# Patient Record
Sex: Female | Born: 1960 | Race: White | Hispanic: No | Marital: Single | Smoking: Never smoker
Health system: Southern US, Community
[De-identification: ages and names within clinical notes are randomized; demographics above are authoritative.]

## PROBLEM LIST (undated history)

## (undated) DIAGNOSIS — I341 Nonrheumatic mitral (valve) prolapse: Secondary | ICD-10-CM

## (undated) DIAGNOSIS — F418 Other specified anxiety disorders: Secondary | ICD-10-CM

## (undated) DIAGNOSIS — C541 Malignant neoplasm of endometrium: Secondary | ICD-10-CM

## (undated) DIAGNOSIS — M1990 Unspecified osteoarthritis, unspecified site: Secondary | ICD-10-CM

## (undated) HISTORY — DX: Malignant neoplasm of endometrium: C54.1

## (undated) HISTORY — DX: Other specified anxiety disorders: F41.8

## (undated) HISTORY — PX: LAPAROSCOPIC HYSTERECTOMY: SHX1926

## (undated) HISTORY — DX: Unspecified osteoarthritis, unspecified site: M19.90

## (undated) HISTORY — PX: ABDOMINAL HYSTERECTOMY: SHX81

## (undated) HISTORY — DX: Nonrheumatic mitral (valve) prolapse: I34.1

---

## 1998-01-12 ENCOUNTER — Emergency Department (HOSPITAL_COMMUNITY): Admission: EM | Admit: 1998-01-12 | Discharge: 1998-01-12 | Payer: Self-pay | Admitting: Emergency Medicine

## 1998-09-08 ENCOUNTER — Emergency Department (HOSPITAL_COMMUNITY): Admission: EM | Admit: 1998-09-08 | Discharge: 1998-09-08 | Payer: Self-pay

## 2000-12-27 ENCOUNTER — Ambulatory Visit (HOSPITAL_COMMUNITY): Admission: RE | Admit: 2000-12-27 | Discharge: 2000-12-27 | Payer: Self-pay | Admitting: Family Medicine

## 2000-12-27 ENCOUNTER — Encounter: Payer: Self-pay | Admitting: Family Medicine

## 2002-03-03 ENCOUNTER — Encounter: Payer: Self-pay | Admitting: Family Medicine

## 2002-03-03 ENCOUNTER — Ambulatory Visit (HOSPITAL_COMMUNITY): Admission: RE | Admit: 2002-03-03 | Discharge: 2002-03-03 | Payer: Self-pay | Admitting: Family Medicine

## 2004-04-21 ENCOUNTER — Ambulatory Visit (HOSPITAL_COMMUNITY): Admission: RE | Admit: 2004-04-21 | Discharge: 2004-04-21 | Payer: Self-pay | Admitting: Family Medicine

## 2004-06-05 ENCOUNTER — Other Ambulatory Visit: Admission: RE | Admit: 2004-06-05 | Discharge: 2004-06-05 | Payer: Self-pay | Admitting: Family Medicine

## 2005-06-18 ENCOUNTER — Other Ambulatory Visit: Admission: RE | Admit: 2005-06-18 | Discharge: 2005-06-18 | Payer: Self-pay | Admitting: Family Medicine

## 2005-07-17 ENCOUNTER — Ambulatory Visit (HOSPITAL_COMMUNITY): Admission: RE | Admit: 2005-07-17 | Discharge: 2005-07-17 | Payer: Self-pay | Admitting: Family Medicine

## 2006-01-15 ENCOUNTER — Other Ambulatory Visit: Admission: RE | Admit: 2006-01-15 | Discharge: 2006-01-15 | Payer: Self-pay | Admitting: Family Medicine

## 2006-09-03 ENCOUNTER — Ambulatory Visit (HOSPITAL_COMMUNITY): Admission: RE | Admit: 2006-09-03 | Discharge: 2006-09-03 | Payer: Self-pay | Admitting: Family Medicine

## 2006-11-22 ENCOUNTER — Other Ambulatory Visit: Admission: RE | Admit: 2006-11-22 | Discharge: 2006-11-22 | Payer: Self-pay | Admitting: Family Medicine

## 2007-11-29 ENCOUNTER — Other Ambulatory Visit: Admission: RE | Admit: 2007-11-29 | Discharge: 2007-11-29 | Payer: Self-pay | Admitting: Family Medicine

## 2007-12-08 ENCOUNTER — Ambulatory Visit (HOSPITAL_COMMUNITY): Admission: RE | Admit: 2007-12-08 | Discharge: 2007-12-08 | Payer: Self-pay | Admitting: Family Medicine

## 2008-12-18 ENCOUNTER — Ambulatory Visit (HOSPITAL_COMMUNITY): Admission: RE | Admit: 2008-12-18 | Discharge: 2008-12-18 | Payer: Self-pay | Admitting: Family Medicine

## 2009-01-15 ENCOUNTER — Other Ambulatory Visit: Admission: RE | Admit: 2009-01-15 | Discharge: 2009-01-15 | Payer: Self-pay | Admitting: Family Medicine

## 2009-12-30 ENCOUNTER — Other Ambulatory Visit: Admission: RE | Admit: 2009-12-30 | Discharge: 2009-12-30 | Payer: Self-pay | Admitting: Family Medicine

## 2010-01-06 ENCOUNTER — Ambulatory Visit (HOSPITAL_COMMUNITY): Admission: RE | Admit: 2010-01-06 | Discharge: 2010-01-06 | Payer: Self-pay | Admitting: Family Medicine

## 2010-12-05 ENCOUNTER — Other Ambulatory Visit (HOSPITAL_COMMUNITY): Payer: Self-pay | Admitting: Family Medicine

## 2010-12-05 DIAGNOSIS — Z1231 Encounter for screening mammogram for malignant neoplasm of breast: Secondary | ICD-10-CM

## 2010-12-09 ENCOUNTER — Other Ambulatory Visit (HOSPITAL_COMMUNITY)
Admission: RE | Admit: 2010-12-09 | Discharge: 2010-12-09 | Disposition: A | Discharge status: Home / Self Care | Payer: BC Managed Care – PPO | Source: Ambulatory Visit | Attending: Family Medicine | Admitting: Family Medicine

## 2010-12-09 ENCOUNTER — Other Ambulatory Visit: Payer: Self-pay | Admitting: Family Medicine

## 2010-12-09 DIAGNOSIS — Z124 Encounter for screening for malignant neoplasm of cervix: Secondary | ICD-10-CM | POA: Insufficient documentation

## 2011-01-08 ENCOUNTER — Ambulatory Visit (HOSPITAL_COMMUNITY)
Admission: RE | Admit: 2011-01-08 | Discharge: 2011-01-08 | Disposition: A | Discharge status: Home / Self Care | Payer: BC Managed Care – PPO | Source: Ambulatory Visit | Attending: Family Medicine | Admitting: Family Medicine

## 2011-01-08 ENCOUNTER — Ambulatory Visit (HOSPITAL_COMMUNITY): Payer: Self-pay

## 2011-01-08 DIAGNOSIS — Z1231 Encounter for screening mammogram for malignant neoplasm of breast: Secondary | ICD-10-CM | POA: Insufficient documentation

## 2011-12-16 ENCOUNTER — Other Ambulatory Visit (HOSPITAL_COMMUNITY): Payer: Self-pay | Admitting: Family Medicine

## 2011-12-16 DIAGNOSIS — Z1231 Encounter for screening mammogram for malignant neoplasm of breast: Secondary | ICD-10-CM

## 2012-01-05 ENCOUNTER — Other Ambulatory Visit (HOSPITAL_COMMUNITY)
Admission: RE | Admit: 2012-01-05 | Discharge: 2012-01-05 | Disposition: A | Discharge status: Home / Self Care | Payer: BC Managed Care – PPO | Source: Ambulatory Visit | Attending: Family Medicine | Admitting: Family Medicine

## 2012-01-05 ENCOUNTER — Other Ambulatory Visit: Payer: Self-pay | Admitting: Family Medicine

## 2012-01-05 DIAGNOSIS — Z124 Encounter for screening for malignant neoplasm of cervix: Secondary | ICD-10-CM | POA: Insufficient documentation

## 2012-01-11 ENCOUNTER — Ambulatory Visit (HOSPITAL_COMMUNITY)
Admission: RE | Admit: 2012-01-11 | Discharge: 2012-01-11 | Disposition: A | Discharge status: Home / Self Care | Payer: BC Managed Care – PPO | Source: Ambulatory Visit | Attending: Family Medicine | Admitting: Family Medicine

## 2012-01-11 DIAGNOSIS — Z1231 Encounter for screening mammogram for malignant neoplasm of breast: Secondary | ICD-10-CM | POA: Insufficient documentation

## 2013-01-12 ENCOUNTER — Other Ambulatory Visit (HOSPITAL_COMMUNITY): Payer: Self-pay | Admitting: Family Medicine

## 2013-01-12 DIAGNOSIS — Z1231 Encounter for screening mammogram for malignant neoplasm of breast: Secondary | ICD-10-CM

## 2013-01-13 ENCOUNTER — Ambulatory Visit (HOSPITAL_COMMUNITY)
Admission: RE | Admit: 2013-01-13 | Discharge: 2013-01-13 | Disposition: A | Discharge status: Home / Self Care | Payer: BC Managed Care – PPO | Source: Ambulatory Visit | Attending: Family Medicine | Admitting: Family Medicine

## 2013-01-13 DIAGNOSIS — Z1231 Encounter for screening mammogram for malignant neoplasm of breast: Secondary | ICD-10-CM

## 2013-12-25 ENCOUNTER — Other Ambulatory Visit (HOSPITAL_COMMUNITY): Payer: Self-pay | Admitting: Family Medicine

## 2013-12-25 ENCOUNTER — Other Ambulatory Visit (HOSPITAL_COMMUNITY): Payer: Self-pay | Admitting: Lactation Services

## 2013-12-25 DIAGNOSIS — Z1231 Encounter for screening mammogram for malignant neoplasm of breast: Secondary | ICD-10-CM

## 2014-01-15 ENCOUNTER — Ambulatory Visit (HOSPITAL_COMMUNITY): Payer: BC Managed Care – PPO

## 2014-01-16 ENCOUNTER — Ambulatory Visit (HOSPITAL_COMMUNITY)
Admission: RE | Admit: 2014-01-16 | Discharge: 2014-01-16 | Disposition: A | Discharge status: Home / Self Care | Payer: BC Managed Care – PPO | Source: Ambulatory Visit | Attending: Family Medicine | Admitting: Family Medicine

## 2014-01-16 DIAGNOSIS — Z1231 Encounter for screening mammogram for malignant neoplasm of breast: Secondary | ICD-10-CM | POA: Insufficient documentation

## 2015-01-15 ENCOUNTER — Other Ambulatory Visit: Payer: Self-pay | Admitting: Family Medicine

## 2015-01-15 ENCOUNTER — Other Ambulatory Visit (HOSPITAL_COMMUNITY)
Admission: RE | Admit: 2015-01-15 | Discharge: 2015-01-15 | Disposition: A | Discharge status: Home / Self Care | Payer: BC Managed Care – PPO | Source: Ambulatory Visit | Attending: Family Medicine | Admitting: Family Medicine

## 2015-01-15 DIAGNOSIS — Z1151 Encounter for screening for human papillomavirus (HPV): Secondary | ICD-10-CM | POA: Diagnosis present

## 2015-01-15 DIAGNOSIS — Z01411 Encounter for gynecological examination (general) (routine) with abnormal findings: Secondary | ICD-10-CM | POA: Diagnosis not present

## 2015-01-16 LAB — CYTOLOGY - PAP

## 2015-04-05 ENCOUNTER — Other Ambulatory Visit: Payer: Self-pay

## 2015-04-05 DIAGNOSIS — Z1231 Encounter for screening mammogram for malignant neoplasm of breast: Secondary | ICD-10-CM

## 2015-04-24 ENCOUNTER — Ambulatory Visit
Admission: RE | Admit: 2015-04-24 | Discharge: 2015-04-24 | Disposition: A | Discharge status: Home / Self Care | Payer: BC Managed Care – PPO | Source: Ambulatory Visit

## 2015-04-24 DIAGNOSIS — Z1231 Encounter for screening mammogram for malignant neoplasm of breast: Secondary | ICD-10-CM

## 2016-03-24 ENCOUNTER — Other Ambulatory Visit: Payer: Self-pay | Admitting: Family Medicine

## 2016-03-24 DIAGNOSIS — Z1231 Encounter for screening mammogram for malignant neoplasm of breast: Secondary | ICD-10-CM

## 2016-04-27 ENCOUNTER — Ambulatory Visit
Admission: RE | Admit: 2016-04-27 | Discharge: 2016-04-27 | Disposition: A | Discharge status: Home / Self Care | Payer: BC Managed Care – PPO | Source: Ambulatory Visit | Attending: Family Medicine | Admitting: Family Medicine

## 2016-04-27 DIAGNOSIS — Z1231 Encounter for screening mammogram for malignant neoplasm of breast: Secondary | ICD-10-CM

## 2017-05-07 ENCOUNTER — Other Ambulatory Visit: Payer: Self-pay | Admitting: Family Medicine

## 2017-05-07 DIAGNOSIS — Z139 Encounter for screening, unspecified: Secondary | ICD-10-CM

## 2017-05-28 ENCOUNTER — Other Ambulatory Visit: Payer: Self-pay | Admitting: Family Medicine

## 2017-05-28 DIAGNOSIS — I340 Nonrheumatic mitral (valve) insufficiency: Secondary | ICD-10-CM

## 2017-05-31 ENCOUNTER — Other Ambulatory Visit (HOSPITAL_COMMUNITY): Payer: BC Managed Care – PPO

## 2017-06-08 ENCOUNTER — Ambulatory Visit: Payer: BC Managed Care – PPO

## 2017-06-14 ENCOUNTER — Telehealth (HOSPITAL_COMMUNITY): Payer: Self-pay | Admitting: Family Medicine

## 2017-06-14 NOTE — Telephone Encounter (Signed)
05/31/17 Patient called and cancelled due to billing concerns. EVD 06/09/17 lmom to sched. echo EVD 06/10/17 lmom to schedule echo EVD 06/14/17 Called and spoke with patient and she voiced that she can not afford test..RG

## 2017-06-28 ENCOUNTER — Other Ambulatory Visit: Payer: Self-pay | Admitting: Obstetrics and Gynecology

## 2017-06-28 ENCOUNTER — Ambulatory Visit: Payer: BC Managed Care – PPO

## 2017-06-28 ENCOUNTER — Other Ambulatory Visit (HOSPITAL_COMMUNITY)
Admission: RE | Admit: 2017-06-28 | Discharge: 2017-06-28 | Disposition: A | Discharge status: Home / Self Care | Payer: BC Managed Care – PPO | Source: Ambulatory Visit | Attending: Obstetrics and Gynecology | Admitting: Obstetrics and Gynecology

## 2017-06-28 DIAGNOSIS — Z124 Encounter for screening for malignant neoplasm of cervix: Secondary | ICD-10-CM | POA: Insufficient documentation

## 2017-07-01 LAB — CYTOLOGY - PAP
Diagnosis: NEGATIVE
HPV: NOT DETECTED

## 2017-07-15 ENCOUNTER — Ambulatory Visit
Admission: RE | Admit: 2017-07-15 | Discharge: 2017-07-15 | Disposition: A | Discharge status: Home / Self Care | Payer: BC Managed Care – PPO | Source: Ambulatory Visit | Attending: Family Medicine | Admitting: Family Medicine

## 2017-07-15 DIAGNOSIS — Z139 Encounter for screening, unspecified: Secondary | ICD-10-CM

## 2018-05-09 ENCOUNTER — Encounter: Payer: Self-pay | Admitting: Neurology

## 2018-05-09 ENCOUNTER — Other Ambulatory Visit: Payer: Self-pay | Admitting: *Deleted

## 2018-05-09 DIAGNOSIS — R200 Anesthesia of skin: Secondary | ICD-10-CM

## 2018-06-02 ENCOUNTER — Ambulatory Visit (INDEPENDENT_AMBULATORY_CARE_PROVIDER_SITE_OTHER): Payer: BC Managed Care – PPO | Admitting: Neurology

## 2018-06-02 DIAGNOSIS — G5603 Carpal tunnel syndrome, bilateral upper limbs: Secondary | ICD-10-CM

## 2018-06-02 DIAGNOSIS — R200 Anesthesia of skin: Secondary | ICD-10-CM | POA: Diagnosis not present

## 2018-06-02 NOTE — Procedures (Signed)
[facility] Neu[facility]  [address]  [place], [place] [address] Tel: [phone] Fax:  [phone] Test Date:  06/02/2018  Patient: Latasha Clark DOB: June 07, 1960 Physician: Narda Amber, DO  Sex: Female Height: 5\' 7"  Ref Phys: Daryll Brod, MD  ID#: [id number] Temp: 36.0C Technician:    Patient Complaints: This is a 58 year old female referred for evaluation of bilateral hand numbness.  NCV & EMG Findings: Extensive electrodiagnostic testing of the right upper extremity and additional studies of the left shows:  1. Bilateral median sensory responses show prolonged distal peak latency (R4.7, L5.8 ms) and reduced amplitude (R8.2, L5.0 V).  Bilateral ulnar sensory responses are within normal limits. 2. Bilateral median motor responses show prolonged latency (R4.4, 5.2 ms) and the amplitude is reduced on the left (5.2 mV).  Additionally there is evidence of a median-to-ulnar crossover in the forearm as seen by a motor response stimulating at the ulnar-wrist and recording at the abductor pollicis brevis muscle.  Bilateral ulnar motor responses are within normal limits. 3. 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  These findings are not present in the right upper extremity.  Impression: Bilateral median neuropathy at or distal to the wrist, consistent with a clinical diagnosis of carpal tunnel syndrome.  Overall, these findings are moderate-to-severe in degree electrically and worse on the left.  Incidentally, there is evidence of bilateral Martin-Gruber anastomosis, a normal variant.   ___________________________ Narda Amber, DO    Nerve Conduction Studies Anti Sensory Summary Table   Site NR Peak (ms) Norm Peak (ms) P-T Amp (V) Norm P-T Amp  Left Median Anti Sensory (2nd Digit)  36C  Wrist    5.8 <3.6 5.0 >15  Right Median Anti Sensory (2nd Digit)  36C  Wrist    4.7 <3.6  8.2 >15  Left Ulnar Anti Sensory (5th Digit)  36C  Wrist    2.7 <3.1 23.5 >10  Right Ulnar Anti Sensory (5th Digit)  36C  Wrist    2.6 <3.1 23.9 >10   Motor Summary Table   Site NR Onset (ms) Norm Onset (ms) O-P Amp (mV) Norm O-P Amp Site1 Site2 Delta-0 (ms) Dist (cm) Vel (m/s) Norm Vel (m/s)  Left Median Motor (Abd Poll Brev)  36C  Wrist    5.2 <4.0 5.2 >6 Elbow Wrist 5.3 30.0 57 >50  Elbow    10.5  4.6  Ulnar-wrist crossover Elbow 5.7 0.0    Ulnar-wrist crossover    4.8  2.2         Right Median Motor (Abd Poll Brev)  36C  Wrist    4.4 <4.0 7.2 >6 Elbow Wrist 5.6 30.0 54 >50  Elbow    10.0  8.0  Ulnar-wrist crossover Elbow 6.3 0.0    Ulnar-wrist crossover    3.7  2.0         Left Ulnar Motor (Abd Dig Minimi)  36C  Wrist    1.8 <3.1 8.7 >7 B Elbow Wrist 4.1 24.0 59 >50  B Elbow    5.9  7.9  A Elbow B Elbow 1.9 10.0 53 >50  A Elbow    7.8  7.6         Right Ulnar Motor (Abd Dig Minimi)  36C  Wrist    1.5 <3.1 9.9 >7 B Elbow Wrist 4.5 24.0 53 >50  B Elbow    6.0  9.6  A Elbow B Elbow 1.9 10.0  53 >50  A Elbow    7.9  9.3          EMG   Side Muscle Ins Act Fibs Psw Fasc Number Recrt Dur Dur. Amp Amp. Poly Poly. Comment  Right 1stDorInt Nml Nml Nml Nml Nml Nml Nml Nml Nml Nml Nml Nml N/A  Right Abd Poll Brev Nml Nml Nml Nml Nml Nml Nml Nml Nml Nml Nml Nml N/A  Right PronatorTeres Nml Nml Nml Nml Nml Nml Nml Nml Nml Nml Nml Nml N/A  Right Biceps Nml Nml Nml Nml Nml Nml Nml Nml Nml Nml Nml Nml N/A  Right Triceps Nml Nml Nml Nml Nml Nml Nml Nml Nml Nml Nml Nml N/A  Right Deltoid Nml Nml Nml Nml Nml Nml Nml Nml Nml Nml Nml Nml N/A  Left 1stDorInt Nml Nml Nml Nml Nml Nml Nml Nml Nml Nml Nml Nml N/A  Left Abd Poll Brev Nml Nml Nml Nml 2- Rapid Some 1+ Some 1+ Some 1+ N/A  Left PronatorTeres Nml Nml Nml Nml Nml Nml Nml Nml Nml Nml Nml Nml N/A  Left Biceps Nml Nml Nml Nml Nml Nml Nml Nml Nml Nml Nml Nml N/A  Left Triceps Nml Nml Nml Nml Nml Nml Nml Nml Nml Nml Nml Nml N/A  Left  Deltoid Nml Nml Nml Nml Nml Nml Nml Nml Nml Nml Nml Nml N/A      Waveforms:

## 2018-06-08 ENCOUNTER — Other Ambulatory Visit: Payer: Self-pay | Admitting: Family Medicine

## 2018-06-08 DIAGNOSIS — Z1231 Encounter for screening mammogram for malignant neoplasm of breast: Secondary | ICD-10-CM

## 2018-07-18 ENCOUNTER — Ambulatory Visit: Payer: BC Managed Care – PPO

## 2018-08-17 ENCOUNTER — Ambulatory Visit
Admission: RE | Admit: 2018-08-17 | Discharge: 2018-08-17 | Disposition: A | Discharge status: Home / Self Care | Payer: BC Managed Care – PPO | Source: Ambulatory Visit | Attending: Family Medicine | Admitting: Family Medicine

## 2018-08-17 ENCOUNTER — Other Ambulatory Visit: Payer: Self-pay

## 2018-08-17 DIAGNOSIS — Z1231 Encounter for screening mammogram for malignant neoplasm of breast: Secondary | ICD-10-CM

## 2018-12-26 ENCOUNTER — Other Ambulatory Visit: Payer: Self-pay | Admitting: Obstetrics and Gynecology

## 2018-12-27 ENCOUNTER — Other Ambulatory Visit: Payer: Self-pay | Admitting: Obstetrics and Gynecology

## 2018-12-27 ENCOUNTER — Other Ambulatory Visit: Payer: Self-pay

## 2018-12-27 ENCOUNTER — Ambulatory Visit
Admission: RE | Admit: 2018-12-27 | Discharge: 2018-12-27 | Disposition: A | Discharge status: Home / Self Care | Payer: BC Managed Care – PPO | Source: Ambulatory Visit | Attending: Obstetrics and Gynecology | Admitting: Obstetrics and Gynecology

## 2018-12-27 DIAGNOSIS — N950 Postmenopausal bleeding: Secondary | ICD-10-CM

## 2018-12-27 MED ORDER — IOPAMIDOL (ISOVUE-300) INJECTION 61%
100.0000 mL | Freq: Once | INTRAVENOUS | Status: AC | PRN
  Administered 2018-12-27: 100 mL via INTRAVENOUS

## 2018-12-30 ENCOUNTER — Other Ambulatory Visit: Payer: BC Managed Care – PPO

## 2019-01-10 ENCOUNTER — Inpatient Hospital Stay (HOSPITAL_COMMUNITY)
Admission: RE | Admit: 2019-01-10 | Discharge: 2019-01-10 | Disposition: A | Discharge status: Home / Self Care | Payer: BC Managed Care – PPO | Source: Ambulatory Visit

## 2019-01-10 NOTE — Progress Notes (Addendum)
Patient arrived to admitting office. Per patient Dr. Landry Mellow is cancelling her surgery and she no longer needs a PAT appointment.  Informed PAT scheduler.

## 2019-01-10 NOTE — Pre-Procedure Instructions (Signed)
Latasha Clark  01/10/2019      [id number] - [address], [address] [place] [address] Phone: [phone] Fax: [phone]    Your procedure is scheduled on January 18, 2019.  Report to [facility]mitting at 115 PM.  Call this number if you have problems the morning of surgery:  [phone]  Call [phone] if you have any questions prior to your surgery date Monday-Friday 8am-4pm    Remember:  Do not eat or drink after midnight.    Take these medicines the morning of surgery with A SIP OF WATER  Citalopram (celexa) Clonazepam (Klonopin)-If needed Albuterol inhaler-if needed Astelin nasal spray-if needed  7 days prior to surgery STOP taking any Allegra-D, Aspirin (unless otherwise instructed by your surgeon) NSAIDS: Indocin (Indomethacin) Aleve, Naproxen, Ibuprofen, Motrin, Advil, Goody's, BC's, all herbal medications, fish oil, and all vitamins    Day of surgery:  Do not wear jewelry, make-up or nail polish.  Do not wear lotions, powders, or perfumes, or deodorant.  Do not shave 48 hours prior to surgery.   Do not bring valuables to the hospital.  [facility] is not responsible for any belongings or valuables.  Contacts, dentures or bridgework may not be worn into surgery.  Leave your suitcase in the car.  After surgery it may be brought to your room.  For patients admitted to the hospital, discharge time will be determined by your treatment team.  Patients discharged the day of surgery will not be allowed to drive home.    [facility]- Preparing For Surgery  Before surgery, you can play an important role. Because skin is not sterile, your skin needs to be as free of germs as possible. You can reduce the number of germs on your skin by washing with CHG (chlorahexidine gluconate) Soap before surgery.  CHG is an antiseptic cleaner which kills germs and bonds with the skin to continue killing germs even  after washing.    Oral Hygiene is also important to reduce your risk of infection.  Remember - BRUSH YOUR TEETH THE MORNING OF SURGERY WITH YOUR REGULAR TOOTHPASTE  Please do not use if you have an allergy to CHG or antibacterial soaps. If your skin becomes reddened/irritated stop using the CHG.  Do not shave (including legs and underarms) for at least 48 hours prior to first CHG shower. It is OK to shave your face.  Please follow these instructions carefully.   1. Shower the NIGHT BEFORE SURGERY and the MORNING OF SURGERY with CHG.   2. If you chose to wash your hair, wash your hair first as usual with your normal shampoo.  3. After you shampoo, rinse your hair and body thoroughly to remove the shampoo.  4. Use CHG as you would any other liquid soap. You can apply CHG directly to the skin and wash gently with a scrungie or a clean washcloth.   5. Apply the CHG Soap to your body ONLY FROM THE NECK DOWN.  Do not use on open wounds or open sores. Avoid contact with your eyes, ears, mouth and genitals (private parts). Wash Face and genitals (private parts)  with your normal soap.  6. Wash thoroughly, paying special attention to the area where your surgery will be performed.  7. Thoroughly rinse your body with warm water from the neck down.  8. DO NOT shower/wash with your normal soap after using and rinsing off the CHG Soap.  9. [place]  yourself dry with a CLEAN TOWEL.  10. Wear CLEAN PAJAMAS to bed the night before surgery, wear comfortable clothes the morning of surgery  11. Place CLEAN SHEETS on your bed the night of your first shower and DO NOT SLEEP WITH PETS.  Day of Surgery: Shower as above Do not apply any deodorants/lotions.  Please wear clean clothes to the hospital/surgery center.   Remember to brush your teeth WITH YOUR REGULAR TOOTHPASTE.  Please read over the following fact sheets that you were given.

## 2019-01-14 ENCOUNTER — Inpatient Hospital Stay (HOSPITAL_COMMUNITY): Admission: RE | Admit: 2019-01-14 | Payer: BC Managed Care – PPO | Source: Ambulatory Visit

## 2019-01-18 ENCOUNTER — Ambulatory Visit: Admit: 2019-01-18 | Payer: BC Managed Care – PPO | Admitting: Obstetrics and Gynecology

## 2019-01-18 SURGERY — DILATATION & CURETTAGE/HYSTEROSCOPY WITH MYOSURE
Anesthesia: Choice

## 2019-07-13 ENCOUNTER — Other Ambulatory Visit: Payer: Self-pay | Admitting: Family Medicine

## 2019-07-13 DIAGNOSIS — Z1231 Encounter for screening mammogram for malignant neoplasm of breast: Secondary | ICD-10-CM

## 2019-07-30 ENCOUNTER — Ambulatory Visit: Payer: BC Managed Care – PPO | Attending: Family Medicine

## 2019-07-30 DIAGNOSIS — Z23 Encounter for immunization: Secondary | ICD-10-CM | POA: Insufficient documentation

## 2019-07-30 NOTE — Progress Notes (Signed)
   Covid-19 Vaccination Clinic  Name:  Latasha Clark    MRN: [id number] DOB: 02-Jul-1960  07/30/2019  [name] was observed post Covid-19 immunization for 15 minutes without incidence. She was provided with Vaccine Information Sheet and instruction to access the V-Safe system.   [name] was instructed to call 911 with any severe reactions post vaccine: Marland Kitchen Difficulty breathing  . Swelling of your face and throat  . A fast heartbeat  . A bad rash all over your body  . Dizziness and weakness    Immunizations Administered    Name Date Dose VIS Date Route   Pfizer COVID-19 Vaccine 07/30/2019  9:55 AM 0.3 mL 05/12/2019 Intramuscular   Manufacturer: [place]   Lot: [id number]   [place]: [id number]

## 2019-08-18 ENCOUNTER — Other Ambulatory Visit: Payer: Self-pay

## 2019-08-18 ENCOUNTER — Ambulatory Visit
Admission: RE | Admit: 2019-08-18 | Discharge: 2019-08-18 | Disposition: A | Discharge status: Home / Self Care | Payer: BC Managed Care – PPO | Source: Ambulatory Visit | Attending: Family Medicine | Admitting: Family Medicine

## 2019-08-18 DIAGNOSIS — Z1231 Encounter for screening mammogram for malignant neoplasm of breast: Secondary | ICD-10-CM

## 2019-08-21 ENCOUNTER — Ambulatory Visit: Payer: BC Managed Care – PPO | Attending: Internal Medicine

## 2019-08-21 DIAGNOSIS — Z23 Encounter for immunization: Secondary | ICD-10-CM

## 2019-08-21 NOTE — Progress Notes (Signed)
   Covid-19 Vaccination Clinic  Name:  Latasha Clark    MRN: [id number] DOB: 06/01/1961  08/21/2019  [name] was observed post Covid-19 immunization for 15 minutes without incident. She was provided with Vaccine Information Sheet and instruction to access the V-Safe system.   [name] was instructed to call 911 with any severe reactions post vaccine: Marland Kitchen Difficulty breathing  . Swelling of face and throat  . A fast heartbeat  . A bad rash all over body  . Dizziness and weakness   Immunizations Administered    Name Date Dose VIS Date Route   Pfizer COVID-19 Vaccine 08/21/2019  9:17 AM 0.3 mL 05/12/2019 Intramuscular   Manufacturer: [place]   Lot: [id number]   [place]: [id number]

## 2019-11-03 NOTE — Progress Notes (Deleted)
Date:  11/03/2019   ID:  Mackensey Bolte, DOB May 13, 1961, MRN [id number]  PCP:  Salvatore Marvel, PA-C  Cardiologist:  Rex Kras, DO, [facility] Providers: *** Cardiothoracic Surgeon: *** Electrophysiologist:  None   REASON FOR CONSULT: ***  REQUESTING PHYSICIAN:  Salvatore Marvel [place],  [place] [id number]  No chief complaint on file.   HPI  Latasha Clark is a 59 y.o. female who is being seen today for the evaluation of *** at the request of Salvatore Marvel, [place]. Patient's past medical history and cardiac risk factors include: ***  Patient is unaccompanied at today's office visit.  Patient is accompanied by *** at today's office visit.    ***  History of  Denies prior history of coronary artery disease, myocardial infarction, congestive heart failure, deep venous thrombosis, pulmonary embolism, stroke, transient ischemic attack.  FUNCTIONAL STATUS: ***   ALLERGIES: Allergies  Allergen Reactions   Penicillins Anaphylaxis and Other (See Comments)    Closed up throat Did it involve swelling of the face/tongue/throat, SOB, or low BP? Yes Did it involve sudden or severe rash/hives, skin peeling, or any reaction on the inside of your mouth or nose? No Did you need to seek medical attention at a hospital or doctor's office? Yes When did it last happen?30s If all above answers are NO, may proceed with cephalosporin use.    Sulfa Antibiotics Rash    MEDICATION LIST PRIOR TO VISIT: No outpatient medications have been marked as taking for the 11/06/19 encounter (Appointment) with Terri Skains, [place], DO.     PAST MEDICAL HISTORY: Past Medical History:  Diagnosis Date   Depression with anxiety    Chronic SSRI and PRN Klonopin   MVP (mitral valve prolapse)    with regurgitation- ABX prophylaxis: ECHO Q 2-3 years    PAST SURGICAL HISTORY: No past surgical history on file.  FAMILY HISTORY: The patient family history includes  Colon cancer in her maternal aunt; Colon polyps in her mother; Diabetes in her mother; Macular degeneration in her mother.  SOCIAL HISTORY:  The patient  reports that she has never smoked. She does not have any smokeless tobacco history on file. She reports current alcohol use. She reports that she does not use drugs.  REVIEW OF SYSTEMS: ROS  PHYSICAL EXAM: No flowsheet data found.  CONSTITUTIONAL: Well-developed and well-nourished. No acute distress.  SKIN: Skin is warm and dry. No rash noted. No cyanosis. No pallor. No jaundice HEAD: Normocephalic and atraumatic.  EYES: No scleral icterus MOUTH/THROAT: Moist oral membranes.  NECK: No JVD present. No thyromegaly noted. No carotid bruits  LYMPHATIC: No visible cervical adenopathy.  CHEST Normal respiratory effort. No intercostal retractions  LUNGS: *** No stridor. No wheezes. No rales.  CARDIOVASCULAR: *** ABDOMINAL: No apparent ascites.  EXTREMITIES: No peripheral edema  HEMATOLOGIC: No significant bruising NEUROLOGIC: Oriented to person, place, and time. Nonfocal. Normal muscle tone.  PSYCHIATRIC: Normal mood and affect. Normal behavior. Cooperative  RADIOLOGY: ***  CARDIAC DATABASE: Coronary artery bypass grafting: Year: Surgical anatomy:  Valve surgery:  Pacemaker/ICD in situ:  EKG: *** 03/27/2019: Sinus Rhythm. Normal ECG No prev ECGs available.  Echocardiogram: ***  Stress Testing: ***  Heart Catheterization: ***  Carotid duplex: ***  Vascular imaging: US Abdomen (AAA screening): Ultrasound arterial duplex: Ultrasound venous duplex:   Holter:  14-Day mobile cardiac ambulatory telemetry:  LABORATORY DATA: No flowsheet data found.  No flowsheet data found.  Lipid Panel  No results found for:  CHOL, TRIG, HDL, CHOLHDL, VLDL, LDLCALC, LDLDIRECT, LABVLDL  No results found for: HGBA1C No components found for: NTPROBNP No results found for: TSH  BMP No results for input(s): NA, K, CL, CO2,  GLUCOSE, BUN, CREATININE, CALCIUM, GFRNONAA, GFRAA in the last 8760 hours.  CBC No results for input(s): WBC, RBC, HGB, HCT, PLT, MCV, MCH, MCHC, RDW, LYMPHSABS, MONOABS, EOSABS, BASOSABS in the last 168 hours.  Invalid input(s): NEUTRABS  HEMOGLOBIN A1C No results found for: HGBA1C, MPG  Cardiac Panel (last 3 results) No results for input(s): CKTOTAL, CKMB, TROPONINI, RELINDX in the last 8760 hours. No results for input(s): TROPIPOC in the last 8760 hours.  BNP (last 3 results) No results for input(s): PROBNP in the last 8760 hours.  TSH No results for input(s): TSH in the last 8760 hours.  CHOLESTEROL No results for input(s): CHOL in the last 8760 hours.  Hepatic Function Panel No results for input(s): PROT, ALBUMIN, AST, ALT, ALKPHOS, BILITOT, BILIDIR, IBILI in the last 8760 hours.  External Labs: *** 09/06/2019 [facility]: Glucose 94, BUN/Cr 24/0.76. EGFR 87. Na/K 141/4.0. CO2 31. Rest of the CMP normal H/H 14/40. MCV 87. Platelets 305  Total Vitamin D 25-Hydroxy 17 (L)  >=30 ng/mL   01/202/2020: Chol 233, TG 90, HDL 69, LDL 145.   IMPRESSION:  No diagnosis found.   RECOMMENDATIONS: Latasha Clark is a 59 y.o. female whose past medical history and cardiac risk factors include: ***   FINAL MEDICATION LIST END OF ENCOUNTER: No orders of the defined types were placed in this encounter.   There are no discontinued medications.   Current Outpatient Medications:    albuterol (PROVENTIL HFA;VENTOLIN HFA) 108 (90 BASE) MCG/ACT inhaler, Inhale 2 puffs into the lungs every 4 (four) hours as needed for wheezing or shortness of breath., Disp: , Rfl:    amoxicillin (AMOXIL) 500 MG capsule, Take 400 mg by mouth See admin instructions. For dental procedures, Disp: , Rfl:    azelastine (ASTELIN) 0.1 % nasal spray, Place 1 spray into both nostrils daily as needed for rhinitis or allergies. Use in each nostril as directed, Disp: , Rfl:    B Complex-C (SUPER B COMPLEX  PO), Take 1 tablet by mouth daily., Disp: , Rfl:    citalopram (CELEXA) 40 MG tablet, Take 40 mg by mouth daily., Disp: , Rfl:    clonazePAM (KLONOPIN) 0.5 MG tablet, Take 0.5 mg by mouth at bedtime as needed for anxiety. , Disp: , Rfl:    fexofenadine-pseudoephedrine (ALLEGRA-D) 60-120 MG 12 hr tablet, Take 1 tablet by mouth daily., Disp: , Rfl:    Gluc-Chonn-MSM-Boswellia-Vit D (GLUCOSAMINE CHOND TRIPLE/VIT D PO), Take 2 tablets by mouth daily., Disp: , Rfl:    indomethacin (INDOCIN) 25 MG capsule, Take 25 mg by mouth daily. , Disp: , Rfl:   No orders of the defined types were placed in this encounter.   There are no Patient Instructions on file for this visit.   --Continue cardiac medications as reconciled in final medication list. --No follow-ups on file. Or sooner if needed. --Continue follow-up with your primary care physician regarding the management of your other chronic comorbid conditions.  Patient's questions and concerns were addressed to her satisfaction. She voices understanding of the instructions provided during this encounter.   This note was created using a voice recognition software as a result there may be grammatical errors inadvertently enclosed that do not reflect the nature of this encounter. Every attempt is made to correct such errors.  [place]  [place] [facility]  Pager: [phone] Office: [phone]

## 2019-11-06 ENCOUNTER — Ambulatory Visit: Payer: Self-pay | Admitting: Cardiology

## 2020-07-24 ENCOUNTER — Other Ambulatory Visit: Payer: Self-pay | Admitting: Nurse Practitioner

## 2020-07-24 DIAGNOSIS — Z1231 Encounter for screening mammogram for malignant neoplasm of breast: Secondary | ICD-10-CM

## 2020-07-29 ENCOUNTER — Other Ambulatory Visit: Payer: Self-pay

## 2020-07-29 ENCOUNTER — Encounter: Payer: Self-pay | Admitting: Neurology

## 2020-07-29 DIAGNOSIS — R200 Anesthesia of skin: Secondary | ICD-10-CM

## 2020-07-29 DIAGNOSIS — R202 Paresthesia of skin: Secondary | ICD-10-CM

## 2020-07-29 NOTE — Progress Notes (Signed)
ee

## 2020-09-12 ENCOUNTER — Other Ambulatory Visit: Payer: Self-pay

## 2020-09-12 ENCOUNTER — Ambulatory Visit
Admission: RE | Admit: 2020-09-12 | Discharge: 2020-09-12 | Disposition: A | Discharge status: Home / Self Care | Payer: BC Managed Care – PPO | Source: Ambulatory Visit | Attending: Nurse Practitioner | Admitting: Nurse Practitioner

## 2020-09-12 DIAGNOSIS — Z1231 Encounter for screening mammogram for malignant neoplasm of breast: Secondary | ICD-10-CM

## 2020-09-17 ENCOUNTER — Ambulatory Visit (INDEPENDENT_AMBULATORY_CARE_PROVIDER_SITE_OTHER): Payer: BC Managed Care – PPO | Admitting: Neurology

## 2020-09-17 ENCOUNTER — Other Ambulatory Visit: Payer: Self-pay

## 2020-09-17 DIAGNOSIS — R200 Anesthesia of skin: Secondary | ICD-10-CM | POA: Diagnosis not present

## 2020-09-17 DIAGNOSIS — R202 Paresthesia of skin: Secondary | ICD-10-CM | POA: Diagnosis not present

## 2020-09-17 DIAGNOSIS — G5622 Lesion of ulnar nerve, left upper limb: Secondary | ICD-10-CM

## 2020-09-17 DIAGNOSIS — G5603 Carpal tunnel syndrome, bilateral upper limbs: Secondary | ICD-10-CM

## 2020-09-17 NOTE — Procedures (Signed)
[facility]  [place], [address]  [place], [place] [address] Tel: [phone] Fax:  [phone] Test Date:  09/17/2020  Patient: Latasha Clark DOB: 09/28/1960 Physician: Narda Amber, DO  Sex: Female Height: 5\' 7"  Ref Phys: Berle Mull, M.D.  ID#: [id number]   Technician:    Patient Complaints: This is a 60 year old female referred for evaluation of bilateral hand paresthesias.  NCV & EMG Findings: Extensive electrodiagnostic testing of the right upper extremity and additional studies of the left shows:  1. Bilateral median sensory responses show prolonged latency (R5.1, L4.7 ms) and reduced amplitude (R6.4, L10.5 V).  Bilateral ulnar sensory responses are within normal limits. 2. Bilateral median motor responses show prolonged latency (R4.2 L4.5 ms) and amplitude is reduced on the left (5.2 mV).  Of note, there is evidence of bilateral Martin-Gruber anastomosis, a normal anatomic variant.  Right ulnar motor responses within normal limits.  Left ulnar motor response shows slowed conduction velocity across the elbow (A Elbow-B Elbow, 43 m/s).   3. Chronic motor axon loss changes are isolated to the left abductor pollicis brevis muscle, without accompanied active denervation.  Impression: 1. Bilateral median neuropathy at or distal to the wrist, consistent with a clinical diagnosis of carpal tunnel syndrome.  Overall, these findings are moderate-to-severe in degree electrically and worse on the left.  Findings are stable as compared to study on 06/02/2018. 2. Left ulnar neuropathy with slowing across the elbow, mild.   ___________________________ Narda Amber, DO    Nerve Conduction Studies Anti Sensory Summary Table   Stim Site NR Peak (ms) Norm Peak (ms) P-T Amp (V) Norm P-T Amp  Left Median Anti Sensory (2nd Digit)  35C  Wrist    4.7 <3.6 10.5 >15  Right Median Anti Sensory (2nd Digit)  35C  Wrist    5.1 <3.6 6.4 >15  Left Ulnar Anti Sensory (5th Digit)   35C  Wrist    2.6 <3.1 30.3 >10  Right Ulnar Anti Sensory (5th Digit)  35C  Wrist    2.4 <3.1 30.9 >10   Motor Summary Table   Stim Site NR Onset (ms) Norm Onset (ms) O-P Amp (mV) Norm O-P Amp Site1 Site2 Delta-0 (ms) Dist (cm) Vel (m/s) Norm Vel (m/s)  Left Median Motor (Abd Poll Brev)  35C  Wrist    4.5 <4.0 5.2 >6 Elbow Wrist 4.3 29.0 67 >50  Elbow    8.8  4.8  Ulnar-wrist crossover Elbow 4.3 0.0    Ulnar-wrist crossover    4.5  1.6         Right Median Motor (Abd Poll Brev)  35C  Wrist    4.2 <4.0 6.7 >6 Elbow Wrist 5.5 29.0 53 >50  Elbow    9.7  7.0  Ulnar-wrist crossover Elbow 6.0 0.0    Ulnar-wrist crossover    3.7  1.6         Left Ulnar Motor (Abd Dig Minimi)  35C  Wrist    2.1 <3.1 8.1 >7 B Elbow Wrist 3.4 22.0 65 >50  B Elbow    5.5  7.3  A Elbow B Elbow 2.3 10.0 43 >50  A Elbow    7.8  7.0         Right Ulnar Motor (Abd Dig Minimi)  35C  Wrist    2.0 <3.1 9.3 >7 B Elbow Wrist 3.7 23.0 62 >50  B Elbow    5.7  8.7  A Elbow B Elbow 1.8 10.0 56 >  50  A Elbow    7.5  8.3          EMG   Side Muscle Ins Act Fibs Psw Fasc Number Recrt Dur Dur. Amp Amp. Poly Poly. Comment  Right 1stDorInt Nml Nml Nml Nml Nml Nml Nml Nml Nml Nml Nml Nml N/A  Right Abd Poll Brev Nml Nml Nml Nml Nml Nml Nml Nml Nml Nml Nml Nml N/A  Right PronatorTeres Nml Nml Nml Nml Nml Nml Nml Nml Nml Nml Nml Nml N/A  Right Biceps Nml Nml Nml Nml Nml Nml Nml Nml Nml Nml Nml Nml N/A  Right Triceps Nml Nml Nml Nml Nml Nml Nml Nml Nml Nml Nml Nml N/A  Right Deltoid Nml Nml Nml Nml Nml Nml Nml Nml Nml Nml Nml Nml N/A  Left 1stDorInt Nml Nml Nml Nml Nml Nml Nml Nml Nml Nml Nml Nml N/A  Left ABD Dig Min Nml Nml Nml Nml Nml Nml Nml Nml Nml Nml Nml Nml N/A  Left FlexCarpiUln Nml Nml Nml Nml Nml Nml Nml Nml Nml Nml Nml Nml N/A  Left PronatorTeres Nml Nml Nml Nml Nml Nml Nml Nml Nml Nml Nml Nml N/A  Left Biceps Nml Nml Nml Nml Nml Nml Nml Nml Nml Nml Nml Nml N/A  Left Triceps Nml Nml Nml Nml Nml Nml Nml Nml Nml  Nml Nml Nml N/A  Left Abd Poll Brev Nml Nml Nml Nml 1- Rapid Some 1+ Some 1+ Nml Nml N/A  Left Deltoid Nml Nml Nml Nml Nml Nml Nml Nml Nml Nml Nml Nml N/A      Waveforms:

## 2020-11-28 ENCOUNTER — Ambulatory Visit: Payer: Self-pay | Admitting: Cardiology

## 2020-12-05 ENCOUNTER — Other Ambulatory Visit: Payer: Self-pay

## 2020-12-05 ENCOUNTER — Ambulatory Visit: Payer: BC Managed Care – PPO | Admitting: Cardiology

## 2020-12-05 ENCOUNTER — Encounter: Payer: Self-pay | Admitting: Cardiology

## 2020-12-05 VITALS — BP 102/76 | HR 112 | Temp 98.1°F | Ht 67.0 in | Wt 163.6 lb

## 2020-12-05 DIAGNOSIS — I341 Nonrheumatic mitral (valve) prolapse: Secondary | ICD-10-CM

## 2020-12-05 DIAGNOSIS — R000 Tachycardia, unspecified: Secondary | ICD-10-CM

## 2020-12-05 DIAGNOSIS — I340 Nonrheumatic mitral (valve) insufficiency: Secondary | ICD-10-CM

## 2020-12-05 NOTE — Patient Instructions (Signed)
Discussed with your insurance to see the cost related to a transesophageal echocardiogram.

## 2020-12-05 NOTE — Progress Notes (Signed)
Date:  12/05/2020   ID:  Latasha Clark, DOB 08-18-1960, MRN [id number]  PCP:  Berkley Harvey, NP  Cardiologist:  Rex Kras, DO, [facility] (established care 12/05/2020)  REASON FOR CONSULT: Mitral valve prolapse  REQUESTING PHYSICIAN:  Salvatore Marvel, PA-C [id number] [place],  [place] [address]  Chief Complaint  Patient presents with   Mitral Valve Prolapse    HPI  Latasha Clark is a 60 y.o. female who presents to the office with a chief complaint of " evaluation of mitral valve prolapse." Patient's past medical history and cardiovascular risk factors include: MVP, mitral regurgitation, hyperlipidemia, postmenopausal female.  She is referred to the office at the request of Salvatore Marvel, [place] for evaluation of MVP.  Patient states that she was diagnosed with mitral valve prolapse in her 92s and since then has been followed remotely with serial echocardiograms.  Her most recent echocardiogram was at [facility] results available in [place] and is now referred to cardiology for further evaluation and management.  Symptomatically she denies chest pain at rest or with effort related activities.  She also denies a dyspnea on exertion, no significant tiredness/fatigue.  No change in overall functional status.  FUNCTIONAL STATUS: No structured exercise program or daily routine.  Takes care of 2 kids less than 32 years of age.  And enjoys swimming periodically.   ALLERGIES: Allergies  Allergen Reactions   Penicillins Anaphylaxis and Other (See Comments)    Closed up throat Did it involve swelling of the face/tongue/throat, SOB, or low BP? Yes Did it involve sudden or severe rash/hives, skin peeling, or any reaction on the inside of your mouth or nose? No Did you need to seek medical attention at a hospital or doctor's office? Yes When did it last happen?      30s If all above answers are "NO", may proceed with cephalosporin use.    Sulfa  Antibiotics Rash    MEDICATION LIST PRIOR TO VISIT: Current Meds  Medication Sig   albuterol (PROVENTIL HFA;VENTOLIN HFA) 108 (90 BASE) MCG/ACT inhaler Inhale 2 puffs into the lungs every 4 (four) hours as needed for wheezing or shortness of breath.   citalopram (CELEXA) 40 MG tablet Take 40 mg by mouth daily.   clonazePAM (KLONOPIN) 0.5 MG tablet Take 0.5 mg by mouth at bedtime as needed for anxiety.    dextromethorphan-guaiFENesin (MUCINEX DM) 30-600 MG 12hr tablet Take 1 tablet by mouth 2 (two) times daily.   fexofenadine-pseudoephedrine (ALLEGRA-D) 60-120 MG 12 hr tablet Take 1 tablet by mouth daily.   fluticasone (FLONASE) 50 MCG/ACT nasal spray Place into the nose.   gabapentin (NEURONTIN) 300 MG capsule Take 1 capsule by mouth 2 (two) times daily.   indomethacin (INDOCIN) 25 MG capsule Take 25 mg by mouth daily.    [DISCONTINUED] Gluc-Chonn-MSM-Boswellia-Vit D (GLUCOSAMINE CHOND TRIPLE/VIT D PO) Take 2 tablets by mouth daily.   [DISCONTINUED] montelukast (SINGULAIR) 10 MG tablet TAKE 1 TABLET BY MOUTH EVERY DAY AT NIGHT   [DISCONTINUED] predniSONE (STERAPRED UNI-PAK 21 TAB) 10 MG (21) TBPK tablet Take by mouth.     PAST MEDICAL HISTORY: Past Medical History:  Diagnosis Date   Arthritis    Depression with anxiety    Chronic SSRI and PRN Klonopin   Endometrial adenocarcinoma (HCC)    MVP (mitral valve prolapse)    with regurgitation- ABX prophylaxis: ECHO Q 2-3 years    PAST SURGICAL HISTORY: Past Surgical History:  Procedure Laterality Date  LAPAROSCOPIC HYSTERECTOMY      FAMILY HISTORY: The patient family history includes Colon cancer in her maternal aunt; Colon polyps in her mother; Diabetes in her mother; Macular degeneration in her mother.  SOCIAL HISTORY:  The patient  reports that she has never smoked. She has never used smokeless tobacco. She reports current alcohol use. She reports that she does not use drugs.  REVIEW OF SYSTEMS: Review of Systems   Constitutional: Negative for chills and fever.  HENT:  Negative for hoarse voice and nosebleeds.   Eyes:  Negative for discharge, double vision and pain.  Cardiovascular:  Negative for chest pain, claudication, dyspnea on exertion, leg swelling, near-syncope, orthopnea, palpitations, paroxysmal nocturnal dyspnea and syncope.  Respiratory:  Negative for hemoptysis and shortness of breath.   Musculoskeletal:  Negative for muscle cramps and myalgias.  Gastrointestinal:  Negative for abdominal pain, constipation, diarrhea, hematemesis, hematochezia, melena, nausea and vomiting.  Neurological:  Negative for dizziness and light-headedness.   PHYSICAL EXAM: Vitals with BMI 12/05/2020  Height 5\' 7"   Weight 163 lbs 10 oz  BMI 63.78  Systolic 588  Diastolic 76  Pulse 502    CONSTITUTIONAL: Well-developed and well-nourished. No acute distress.  SKIN: Skin is warm and dry. No rash noted. No cyanosis. No pallor. No jaundice HEAD: Normocephalic and atraumatic.  EYES: No scleral icterus MOUTH/THROAT: Moist oral membranes.  NECK: No JVD present. No thyromegaly noted. No carotid bruits  LYMPHATIC: No visible cervical adenopathy.  CHEST Normal respiratory effort. No intercostal retractions  LUNGS: Clear to auscultation bilaterally.  No stridor. No wheezes. No rales.  CARDIOVASCULAR: Regular, positive S1-S2, 3 out of 6 holosystolic murmur heard over the precordium, no gallops or rubs ABDOMINAL: Soft, nontender, nondistended, positive bowel sounds in all 4 quadrants, no apparent ascites.  EXTREMITIES: No peripheral edema, 2+ dorsalis pedis and posterior tibial pulses. HEMATOLOGIC: No significant bruising NEUROLOGIC: Oriented to person, place, and time. Nonfocal. Normal muscle tone.  PSYCHIATRIC: Normal mood and affect. Normal behavior. Cooperative  CARDIAC DATABASE: EKG: 12/05/2020: Normal sinus rhythm, 94 bpm, normal axis, without underlying injury pattern.  Echocardiogram: 09/13/2020: [facility], available in [place]. The left ventricular size is normal. Mild left ventricular hypertrophy Left ventricular systolic function is normal. LV ejection fraction = 55-60%. The left ventricular wall motion is normal. The left atrium is moderately dilated. There is moderate mitral regurgitation. The mitral regurgitant jet is eccentrically directed. Thickened myxomatous mitral valve leaflets There is posterior mitral valve prolapse  Stress Testing: No results found for this or any previous visit from the past 1095 days.  Heart Catheterization: None   LABORATORY DATA: No flowsheet data found.  No flowsheet data found.  Lipid Panel  No results found for: CHOL, TRIG, HDL, CHOLHDL, VLDL, LDLCALC, LDLDIRECT, LABVLDL  No components found for: NTPROBNP No results for input(s): PROBNP in the last 8760 hours. No results for input(s): TSH in the last 8760 hours.  BMP No results for input(s): NA, K, CL, CO2, GLUCOSE, BUN, CREATININE, CALCIUM, GFRNONAA, GFRAA in the last 8760 hours.  HEMOGLOBIN A1C No results found for: HGBA1C, MPG  IMPRESSION:    ICD-10-CM   1. MVP (mitral valve prolapse)  I34.1 EKG 12-Lead    LONG TERM MONITOR (3-14 DAYS)    2. Nonrheumatic mitral valve regurgitation  I34.0     3. Tachycardia  R00.0 LONG TERM MONITOR (3-14 DAYS)       RECOMMENDATIONS: Copelyn Widmer is a 60 y.o. female whose past medical history and  cardiac risk factors include: MVP, mitral regurgitation, hyperlipidemia, postmenopausal female.  Patient presents to the office to establish care given her underlying mitral valve prolapse and mitral regurgitation.  She was diagnosed with MVP in her 41s and has been followed up with serial echocardiograms according to her.  I do not have prior echocardiograms for comparison.  Most recent echocardiogram performed at [facility] only the report is available and no images were reviewed.  Based on  auscultatory findings I suspect that the MR is at least moderate if not severe.  And therefore discussed undergoing transesophageal echocardiogram to further evaluate the mitral apparatus and the severity of mitral regurgitation.  However, patient is concerned that it may not be a covered benefit under her current drug plan and would like to discuss it further with her insurance company prior to proceeding forward with TEE.  Discussed the risks, benefits, and alternatives to transesophageal echocardiogram at today's office visit.  She will call us back to schedule it in the interim.  Given mitral valve prolapse and tachycardia I recommend undergoing an extended Holter monitor (14 days) to evaluate for dysrhythmias.  Most recent lipid profile reviewed.  Patient is LDL levels are not at goal.  Her estimated 10-year risk of ASCVD is approximately 1.9%.  Educated on the importance of decreasing intake of cholesterol rich foods.  Patient will continue to follow-up with her primary care provider for longitudinal care with regards to lipid management  FINAL MEDICATION LIST END OF ENCOUNTER: No orders of the defined types were placed in this encounter.   Medications Discontinued During This Encounter  Medication Reason   amoxicillin (AMOXIL) 500 MG capsule Error   azelastine (ASTELIN) 0.1 % nasal spray Error   B Complex-C (SUPER B COMPLEX PO) Error   Gluc-Chonn-MSM-Boswellia-Vit D (GLUCOSAMINE CHOND TRIPLE/VIT D PO) Error   montelukast (SINGULAIR) 10 MG tablet Error   predniSONE (STERAPRED UNI-PAK 21 TAB) 10 MG (21) TBPK tablet Error     Current Outpatient Medications:    albuterol (PROVENTIL HFA;VENTOLIN HFA) 108 (90 BASE) MCG/ACT inhaler, Inhale 2 puffs into the lungs every 4 (four) hours as needed for wheezing or shortness of breath., Disp: , Rfl:    citalopram (CELEXA) 40 MG tablet, Take 40 mg by mouth daily., Disp: , Rfl:    clonazePAM (KLONOPIN) 0.5 MG tablet, Take 0.5 mg by mouth at bedtime as  needed for anxiety. , Disp: , Rfl:    dextromethorphan-guaiFENesin (MUCINEX DM) 30-600 MG 12hr tablet, Take 1 tablet by mouth 2 (two) times daily., Disp: , Rfl:    fexofenadine-pseudoephedrine (ALLEGRA-D) 60-120 MG 12 hr tablet, Take 1 tablet by mouth daily., Disp: , Rfl:    fluticasone (FLONASE) 50 MCG/ACT nasal spray, Place into the nose., Disp: , Rfl:    gabapentin (NEURONTIN) 300 MG capsule, Take 1 capsule by mouth 2 (two) times daily., Disp: , Rfl:    indomethacin (INDOCIN) 25 MG capsule, Take 25 mg by mouth daily. , Disp: , Rfl:   Orders Placed This Encounter  Procedures   LONG TERM MONITOR (3-14 DAYS)   EKG 12-Lead    Patient Instructions  Discussed with your insurance to see the cost related to a transesophageal echocardiogram.   --Continue cardiac medications as reconciled in final medication list. --Return in about 4 weeks (around 01/02/2021) for Follow up MVP and review test results. . Or sooner if needed. --Continue follow-up with your primary care physician regarding the management of your other chronic comorbid conditions.  Patient's  questions and concerns were addressed to her satisfaction. She voices understanding of the instructions provided during this encounter.   This note was created using a voice recognition software as a result there may be grammatical errors inadvertently enclosed that do not reflect the nature of this encounter. Every attempt is made to correct such errors.  Rex Kras, [place], [facility]  Pager: [phone] Office: [phone]

## 2020-12-09 ENCOUNTER — Inpatient Hospital Stay: Payer: BC Managed Care – PPO

## 2021-01-07 ENCOUNTER — Ambulatory Visit: Payer: BC Managed Care – PPO | Admitting: Cardiology

## 2021-02-07 ENCOUNTER — Inpatient Hospital Stay: Payer: BC Managed Care – PPO

## 2021-03-06 ENCOUNTER — Ambulatory Visit: Payer: BC Managed Care – PPO | Admitting: Cardiology

## 2021-08-12 LAB — COLOGUARD

## 2021-08-15 ENCOUNTER — Other Ambulatory Visit: Payer: Self-pay | Admitting: Nurse Practitioner

## 2021-08-15 DIAGNOSIS — Z1231 Encounter for screening mammogram for malignant neoplasm of breast: Secondary | ICD-10-CM

## 2021-09-15 ENCOUNTER — Ambulatory Visit: Payer: BC Managed Care – PPO

## 2021-09-19 ENCOUNTER — Ambulatory Visit
Admission: RE | Admit: 2021-09-19 | Discharge: 2021-09-19 | Disposition: A | Discharge status: Home / Self Care | Payer: BC Managed Care – PPO | Source: Ambulatory Visit | Attending: Nurse Practitioner | Admitting: Nurse Practitioner

## 2021-09-19 DIAGNOSIS — Z1231 Encounter for screening mammogram for malignant neoplasm of breast: Secondary | ICD-10-CM

## 2022-01-23 IMAGING — MG DIGITAL SCREENING BILAT W/ TOMO W/ CAD
8 series · 9 of 24 positions shown · non-contrast
Comparison: Previous exam(s).

CLINICAL DATA: Screening.

EXAM:
DIGITAL SCREENING BILATERAL MAMMOGRAM WITH TOMO AND CAD

[R MLO synth-2D]
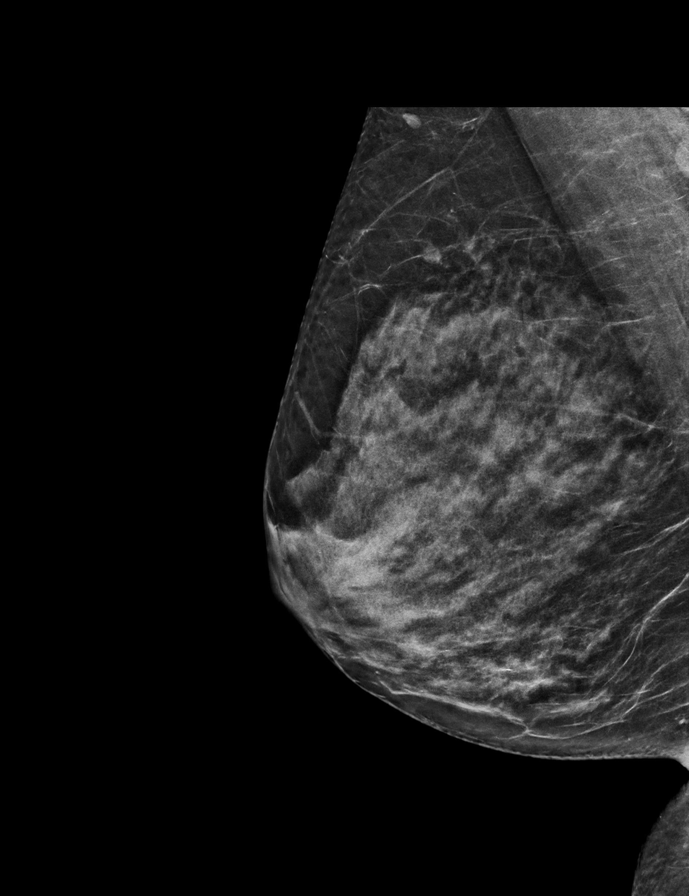

[L MLO synth-2D]
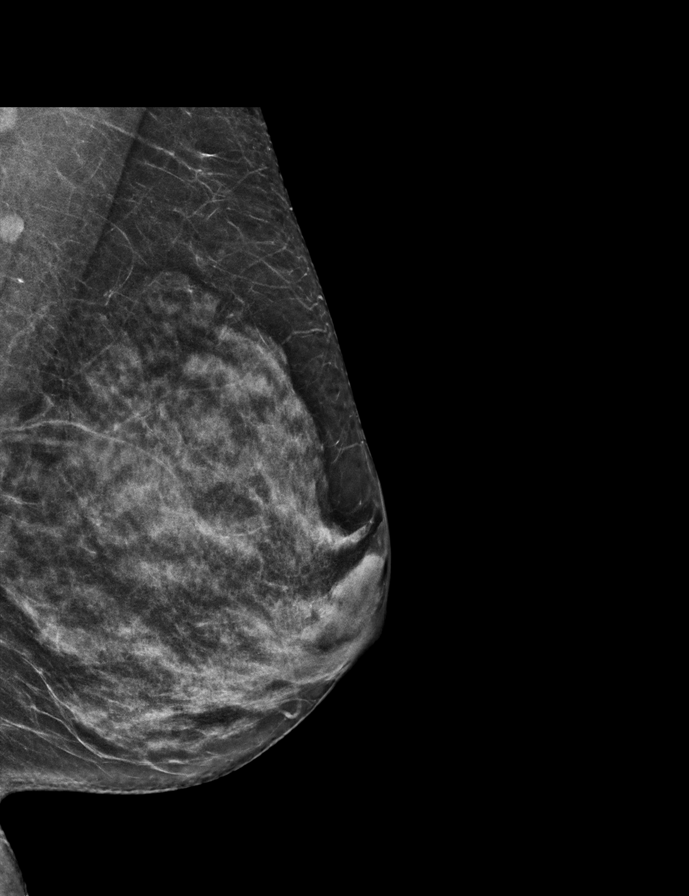

[L CC synth-2D]
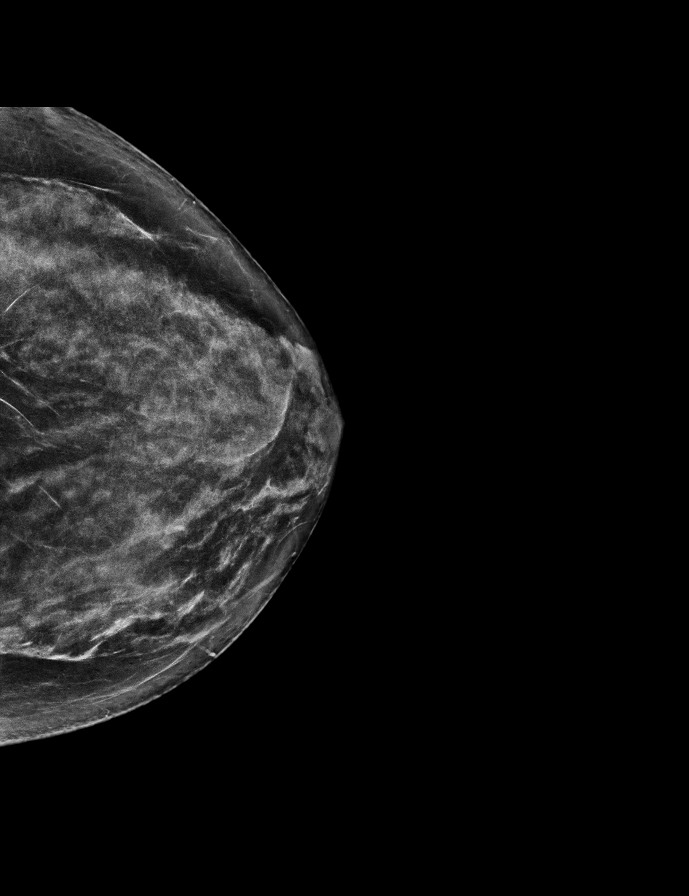

[R CC synth-2D]
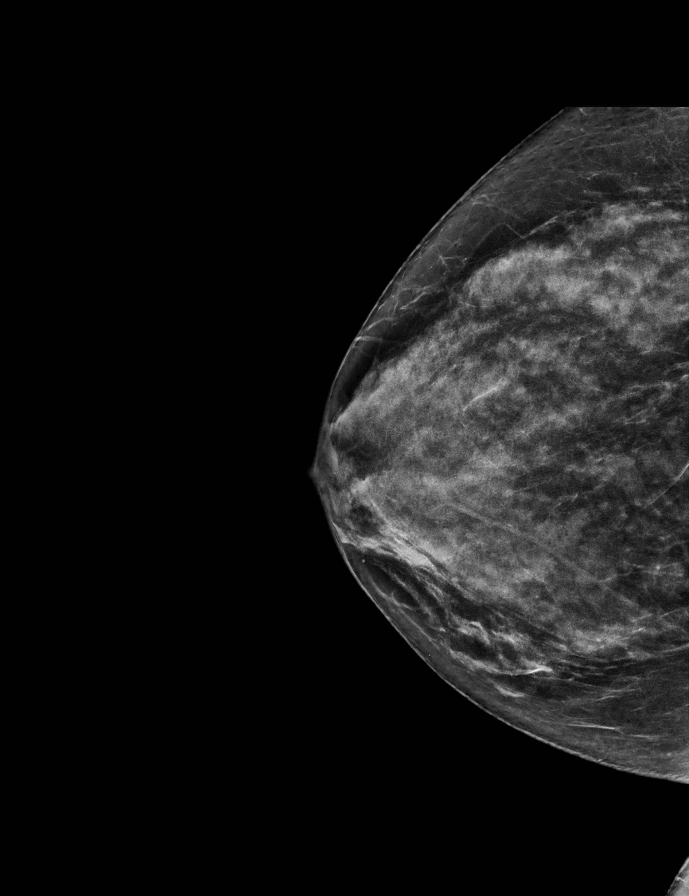

[R MLO tomo · 2 of 58 frames shown]
[frame 19/58]
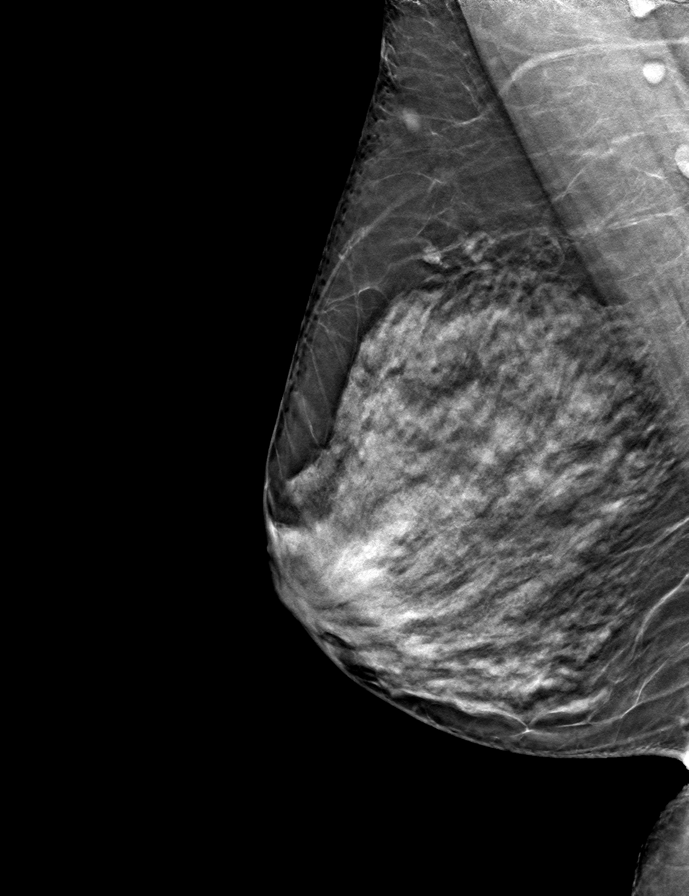
[frame 29/58]
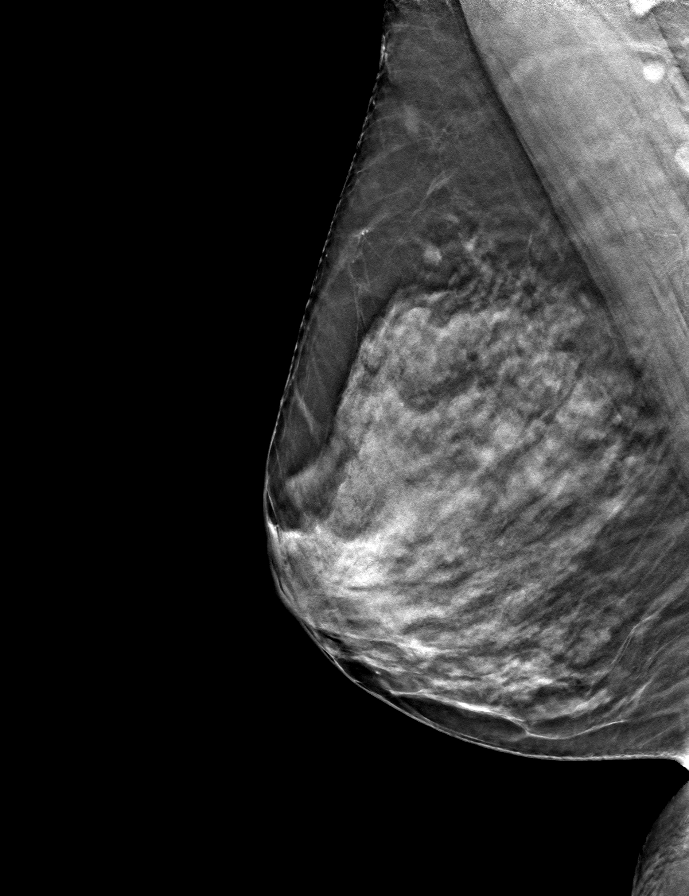

[R CC tomo · tomo slice 30/59.0]
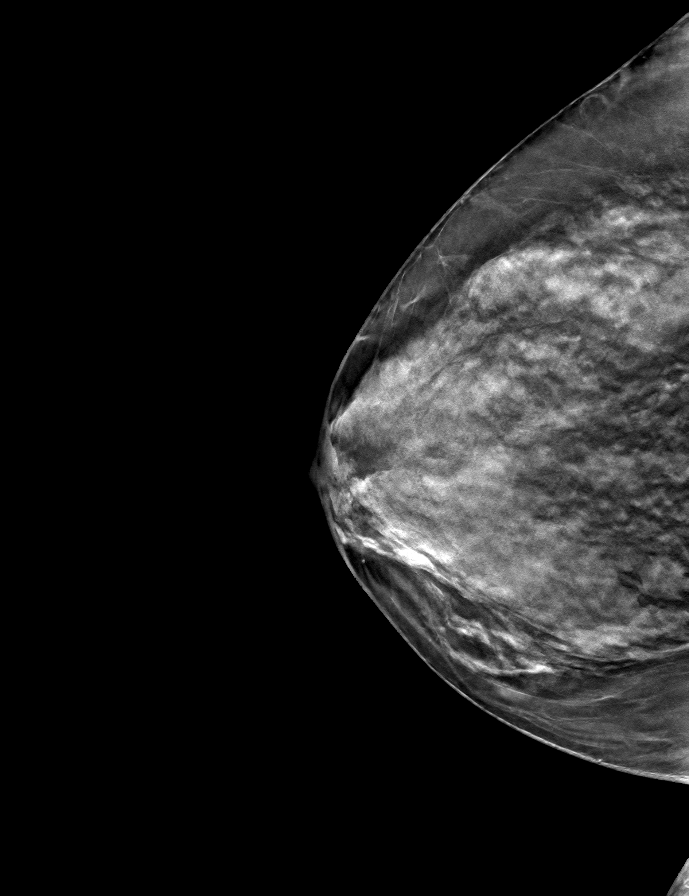

[L CC tomo · tomo slice 31/62.0]
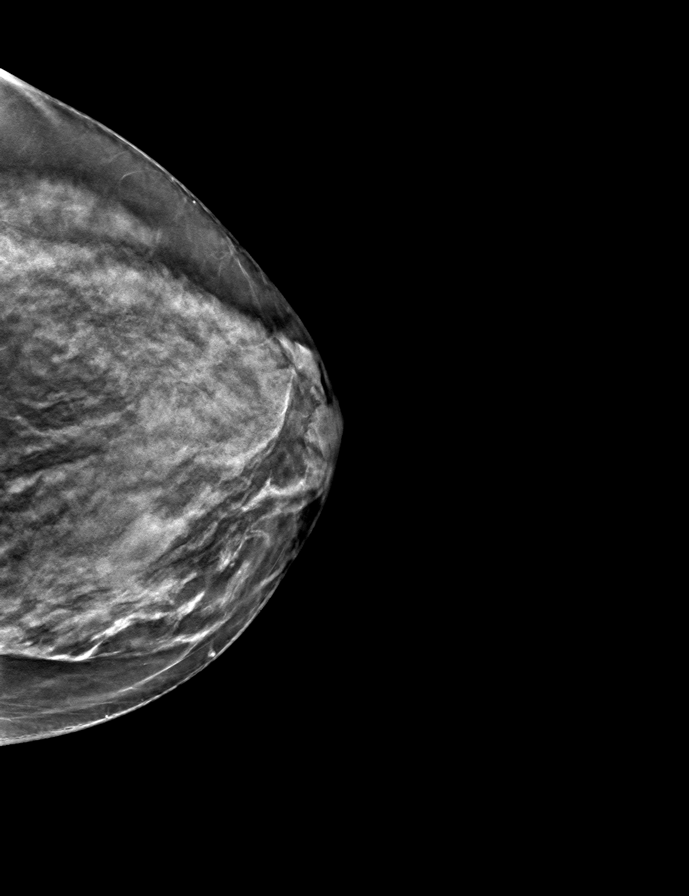

[L MLO tomo · tomo slice 29/58.0]
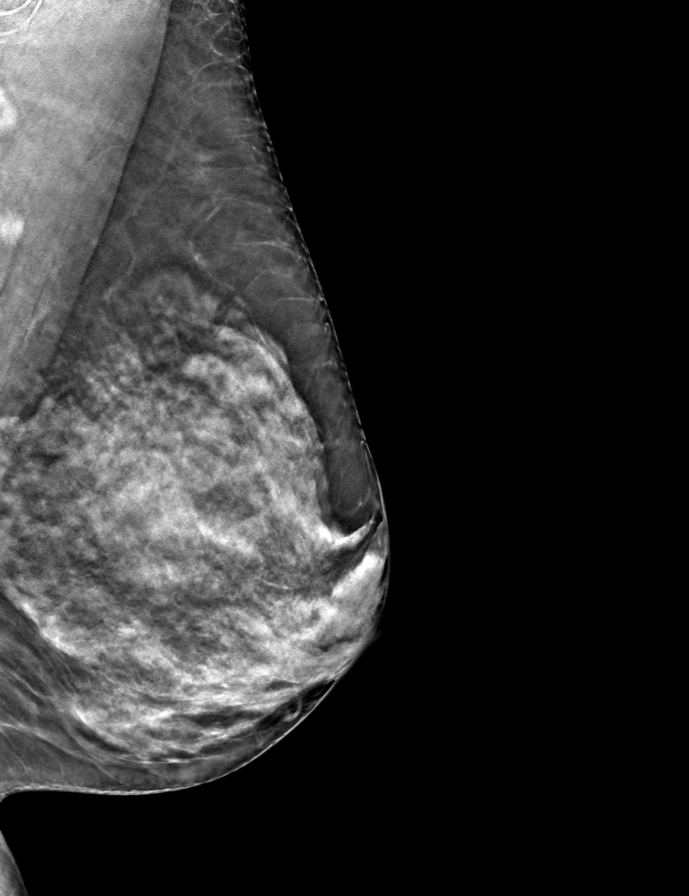

[9 of 24 positions shown; findings below may reference images not displayed]

ACR Breast Density Category c: The breast tissue is heterogeneously
dense, which may obscure small masses.
FINDINGS: There are no findings suspicious for malignancy. Images were
processed with CAD.
IMPRESSION: No mammographic evidence of malignancy. A result letter of this
screening mammogram will be mailed directly to the patient.

RECOMMENDATION:
Screening mammogram in one year. (Code:[id number])

BI-RADS CATEGORY  1: Negative.

## 2022-10-09 ENCOUNTER — Other Ambulatory Visit: Payer: Self-pay | Admitting: Nurse Practitioner

## 2022-10-09 DIAGNOSIS — Z0000 Encounter for general adult medical examination without abnormal findings: Secondary | ICD-10-CM

## 2022-11-06 ENCOUNTER — Ambulatory Visit
Admission: RE | Admit: 2022-11-06 | Discharge: 2022-11-06 | Disposition: A | Discharge status: Home / Self Care | Payer: BC Managed Care – PPO | Source: Ambulatory Visit | Attending: Nurse Practitioner | Admitting: Nurse Practitioner

## 2022-11-06 DIAGNOSIS — Z0000 Encounter for general adult medical examination without abnormal findings: Secondary | ICD-10-CM

## 2023-11-05 ENCOUNTER — Other Ambulatory Visit: Payer: Self-pay | Admitting: Nurse Practitioner

## 2023-11-05 DIAGNOSIS — Z1231 Encounter for screening mammogram for malignant neoplasm of breast: Secondary | ICD-10-CM

## 2023-11-12 ENCOUNTER — Ambulatory Visit
Admission: RE | Admit: 2023-11-12 | Discharge: 2023-11-12 | Disposition: A | Discharge status: Home / Self Care | Payer: Self-pay | Source: Ambulatory Visit | Attending: Nurse Practitioner | Admitting: Nurse Practitioner

## 2023-11-12 DIAGNOSIS — Z1231 Encounter for screening mammogram for malignant neoplasm of breast: Secondary | ICD-10-CM
